# Patient Record
Sex: Male | Born: 1995 | Hispanic: Yes | Marital: Single | Smoking: Never smoker
Health system: Southern US, Community
[De-identification: ages and names within clinical notes are randomized; demographics above are authoritative.]

## PROBLEM LIST (undated history)

## (undated) DIAGNOSIS — F419 Anxiety disorder, unspecified: Secondary | ICD-10-CM

## (undated) DIAGNOSIS — F329 Major depressive disorder, single episode, unspecified: Secondary | ICD-10-CM

## (undated) DIAGNOSIS — F32A Depression, unspecified: Secondary | ICD-10-CM

## (undated) HISTORY — DX: Anxiety disorder, unspecified: F41.9

## (undated) HISTORY — PX: NO PAST SURGERIES: SHX2092

## (undated) HISTORY — DX: Depression, unspecified: F32.A

---
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
## 2019-05-18 ENCOUNTER — Telehealth: Payer: Self-pay

## 2019-05-18 NOTE — Telephone Encounter (Signed)
Copied from CRM [id number]. Topic: General - Call Back - No Documentation >> May 18, 2019  3:51 PM Randol Kern wrote: Has new pt appt 05/28/2019. Wants to come in this week for immunizations for school.  Best contact [phone] Requesting immunizations for school Tdap + (pt cant remember)

## 2019-05-19 NOTE — Telephone Encounter (Signed)
Attempted to contact patient on home phone and number listed below in message with no answer, Okay to schedule appointment this week with Spencer Bryant for Tdap vaccine. After reviewing records patient is due for this and influenza shot. KW

## 2019-05-20 ENCOUNTER — Other Ambulatory Visit: Payer: Self-pay

## 2019-05-20 ENCOUNTER — Encounter: Payer: Self-pay | Admitting: Adult Health

## 2019-05-20 ENCOUNTER — Ambulatory Visit (INDEPENDENT_AMBULATORY_CARE_PROVIDER_SITE_OTHER): Payer: Managed Care, Other (non HMO) | Admitting: Adult Health

## 2019-05-20 VITALS — Temp 96.9°F

## 2019-05-20 DIAGNOSIS — Z23 Encounter for immunization: Secondary | ICD-10-CM | POA: Diagnosis not present

## 2019-05-20 NOTE — Telephone Encounter (Signed)
Patient has scheduled appointment this afternoon to receive vaccine. KW

## 2019-05-20 NOTE — Progress Notes (Signed)
Patient comes into office today to update immunization(s), patient reports that they feel well today and have no complaints or concerns to address. Immunization record has been reviewed with patient and information handout in regards to vaccine has been given. Patient was observed after injection and tolerated well with no adverse reaction or side effects.   Today's Vitals   05/20/19 1531  Temp: (!) 96.9 F (36.1 C)  TempSrc: Oral   There is no height or weight on file to calculate BMI.

## 2019-05-28 ENCOUNTER — Encounter: Payer: Self-pay | Admitting: Adult Health

## 2019-05-28 ENCOUNTER — Other Ambulatory Visit: Payer: Self-pay

## 2019-05-28 ENCOUNTER — Ambulatory Visit (INDEPENDENT_AMBULATORY_CARE_PROVIDER_SITE_OTHER): Payer: Managed Care, Other (non HMO) | Admitting: Adult Health

## 2019-05-28 VITALS — BP 114/80 | HR 65 | Temp 96.9°F | Resp 15 | Ht 65.0 in | Wt 183.4 lb

## 2019-05-28 DIAGNOSIS — Z0000 Encounter for general adult medical examination without abnormal findings: Secondary | ICD-10-CM | POA: Diagnosis not present

## 2019-05-28 DIAGNOSIS — Z1329 Encounter for screening for other suspected endocrine disorder: Secondary | ICD-10-CM

## 2019-05-28 DIAGNOSIS — Z111 Encounter for screening for respiratory tuberculosis: Secondary | ICD-10-CM

## 2019-05-28 DIAGNOSIS — R635 Abnormal weight gain: Secondary | ICD-10-CM

## 2019-05-28 DIAGNOSIS — Z13220 Encounter for screening for lipoid disorders: Secondary | ICD-10-CM

## 2019-05-28 DIAGNOSIS — E669 Obesity, unspecified: Secondary | ICD-10-CM

## 2019-05-28 DIAGNOSIS — M25562 Pain in left knee: Secondary | ICD-10-CM

## 2019-05-28 NOTE — Patient Instructions (Addendum)
Preventive Care 1-22 Years Old, Male Preventive care refers to lifestyle choices and visits with your health care provider that can promote health and wellness. At this stage in your life, you may start seeing a primary care physician instead of a pediatrician. Your health care is now your responsibility. Preventive care for young adults includes: A yearly physical exam. This is also called an annual wellness visit. Acute Knee Pain, Adult Many things can cause knee pain. Sometimes, knee pain is sudden (acute) and may be caused by damage, swelling, or irritation of the muscles and tissues that support your knee. The pain often goes away on its own with time and rest. If the pain does not go away, tests may be done to find out what is causing the pain. Follow these instructions at home: Pay attention to any changes in your symptoms. Take these actions to relieve your pain. If you have a knee sleeve or brace:  Wear the sleeve or brace as told by your doctor. Remove it only as told by your doctor. Loosen the sleeve or brace if your toes: Tingle. Become numb. Turn cold and blue. Keep the sleeve or brace clean. If the sleeve or brace is not waterproof: Do not let it get wet. Cover it with a watertight covering when you take a bath or shower. Activity Rest your knee. Do not do things that cause pain. Avoid activities where both feet leave the ground at the same time (high-impact activities). Examples are running, jumping rope, and doing jumping jacks. Work with a physical therapist to make a safe exercise program, as told by your doctor. Managing pain, stiffness, and swelling  If told, put ice on the knee: Put ice in a plastic bag. Place a towel between your skin and the bag. Leave the ice on for 20 minutes, 2-3 times a day. If told, put pressure (compression) on your injured knee to control swelling, give support, and help with discomfort. Compression may be done with an elastic  bandage. General instructions Take all medicines only as told by your doctor. Raise (elevate) your knee while you are sitting or lying down. Make sure your knee is higher than your heart. Sleep with a pillow under your knee. Do not use any products that contain nicotine or tobacco. These include cigarettes, e-cigarettes, and chewing tobacco. These products may slow down healing. If you need help quitting, ask your doctor. If you are overweight, work with your doctor and a food expert (dietitian) to set goals to lose weight. Being overweight can make your knee hurt more. Keep all follow-up visits as told by your doctor. This is important. Contact a doctor if: The knee pain does not stop. The knee pain changes or gets worse. You have a fever along with knee pain. Your knee feels warm when you touch it. Your knee gives out or locks up. Get help right away if: Your knee swells, and the swelling gets worse. You cannot move your knee. You have very bad knee pain. Summary Many things can cause knee pain. The pain often goes away on its own with time and rest. Your doctor may do tests to find out the cause of the pain. Pay attention to any changes in your symptoms. Relieve your pain with rest, medicines, light activity, and use of ice. Get help right away if you cannot move your knee or your knee pain is very bad. This information is not intended to replace advice given to you by your health care  provider. Make sure you discuss any questions you have with your health care provider. Document Revised: 10/02/2017 Document Reviewed: 10/02/2017 Elsevier Patient Education  2020 [employer]    Regular dental and eye exams.  Immunizations.  Screening for certain conditions.  Healthy lifestyle choices, such as diet and exercise. What can I expect for my preventive care visit? Physical exam Your health care provider may check:  Height and weight. These may be used to calculate body mass  index (BMI), which is a measurement that tells if you are at a healthy weight.  Heart rate and blood pressure.  Body temperature. Counseling Your health care provider may ask you questions about:  Past medical problems and family medical history.  Alcohol, tobacco, and drug use.  Home and relationship well-being.  Access to firearms.  Emotional well-being.  Diet, exercise, and sleep habits.  Sexual activity and sexual health. What immunizations do I need?  Influenza (flu) vaccine  This is recommended every year. Tetanus, diphtheria, and pertussis (Tdap) vaccine  You may need a Td booster every 10 years. Varicella (chickenpox) vaccine  You may need this vaccine if you have not already been vaccinated. Human papillomavirus (HPV) vaccine  If recommended by your health care provider, you may need three doses over 6 months. Measles, mumps, and rubella (MMR) vaccine  You may need at least one dose of MMR. You may also need a second dose. Meningococcal conjugate (MenACWY) vaccine  One dose is recommended if you are 19-21 years old and a first-year college student living in a residence hall, or if you have one of several medical conditions. You may also need additional booster doses. Pneumococcal conjugate (PCV13) vaccine  You may need this if you have certain conditions and were not previously vaccinated. Pneumococcal polysaccharide (PPSV23) vaccine  You may need one or two doses if you smoke cigarettes or if you have certain conditions. Hepatitis A vaccine  You may need this if you have certain conditions or if you travel or work in places where you may be exposed to hepatitis A. Hepatitis B vaccine  You may need this if you have certain conditions or if you travel or work in places where you may be exposed to hepatitis B. Haemophilus influenzae type b (Hib) vaccine  You may need this if you have certain risk factors. You may receive vaccines as individual doses or  as more than one vaccine together in one shot (combination vaccines). Talk with your health care provider about the risks and benefits of combination vaccines. What tests do I need? Blood tests  Lipid and cholesterol levels. These may be checked every 5 years starting at age 20.  Hepatitis C test.  Hepatitis B test. Screening  Genital exam to check for testicular cancer or hernias.  Sexually transmitted disease (STD) testing, if you are at risk. Other tests  Tuberculosis skin test.  Vision and hearing tests.  Skin exam. Follow these instructions at home: Eating and drinking   Eat a diet that includes fresh fruits and vegetables, whole grains, lean protein, and low-fat dairy products.  Drink enough fluid to keep your urine pale yellow.  Do not drink alcohol if: ? Your health care provider tells you not to drink. ? You are under the legal drinking age. In the [place] the legal drinking age is 21.  If you drink alcohol: ? Limit how much you have to 0-2 drinks a day. ? Be aware of how much alcohol is in your drink. In the   [place] one drink equals one 12 oz bottle of beer (355 mL), one 5 oz glass of wine (148 mL), or one 1 oz glass of hard liquor (44 mL). Lifestyle  Take daily care of your teeth and gums.  Stay active. Exercise at least 30 minutes 5 or more days of the week.  Do not use any products that contain nicotine or tobacco, such as cigarettes, e-cigarettes, and chewing tobacco. If you need help quitting, ask your health care provider.  Do not use drugs.  If you are sexually active, practice safe sex. Use a condom or other form of protection to prevent STIs (sexually transmitted infections).  Find healthy ways to cope with stress, such as: ? Meditation, yoga, or listening to music. ? Journaling. ? Talking to a trusted person. ? Spending time with friends and family. Safety  Always wear your seat belt while driving or riding in a vehicle.  Do not drive if you  have been drinking alcohol.  Do not ride with someone who has been drinking.  Do not drive when you are tired or distracted.  Do not text while driving.  Wear a helmet and other protective equipment during sports activities.  If you have firearms in your house, make sure you follow all gun safety procedures.  Seek help if you have been bullied, physically abused, or sexually abused.  Use the Internet responsibly to avoid dangers such as online bullying and online sex predators. What's next?  Go to your health care provider once a year for a well check visit.  Ask your health care provider how often you should have your eyes and teeth checked.  Stay up to date on all vaccines. This information is not intended to replace advice given to you by your health care provider. Make sure you discuss any questions you have with your health care provider. Document Revised: 04/16/2018 Document Reviewed: 04/16/2018 Elsevier Patient Education  2020 [employer]  

## 2019-05-28 NOTE — Progress Notes (Signed)
Patient: Spencer Bryant, Male    DOB: [birth year], 24 y.o.   MRN: [id number] Visit Date: 05/28/2019  Today's Provider: Jairo Ben, FNP   Chief Complaint  Patient presents with  . New Patient (Initial Visit)   Subjective:    New Patient Spencer Bryant is a 24 y.o. male who presents today for health maintenance and establish care. He feels well. He reports exercising . He reports he is sleeping well.  He is transferring to [employer] from [place]. He is planning to live on campus. He is concerning of his weight he has gained in college. He reports 40 lbs since starting college. He is currently a Holiday representative.  He denies any fatigue. Has any skin concerns or rashes.  Denies any changing lesions or moles. VS PCP pediatrician was pediatrics [facility]. He exercises regularly.  He denies any STD symptoms or concerns. He has one partner for the last 4 years.   He got influenza at [facility], 2 weeks ago- 2021 January.   Patient also needs a immunization record filled out from his school at [employer].  He came in on January 14 and got his Tdap shot so that he would be up-to-date.  Provider will review immunizations and and CDR and sent to school once his chronic Quantiferon Gold returns.  He declines HPV vaccine. He does see a dentist yearly and sees an eye doctor as he wears contacts.  He does not sleep in his contacts. He has a history of knee pain that comes intermittent, occurred in February. Resolved. Occasionally has pains in knee. He wears a brace and  Continues his day. Denies any popping or clicking. Denies any trauma.   He denies any other concerns at this visit.  Denies any suicidal or homicidal thoughts or ideas/intentions.  He has never been on medication for depression, he had feels he is coping well. Patient  denies any fever, body aches,chills, rash, chest pain, shortness of breath, nausea, vomiting, or diarrhea.    -----------------------------------------------------------------   Review of Systems  Constitutional: Positive for unexpected weight change (He is concerned that he has gained 40 pounds since starting his freshman year.  Is currently a Holiday representative.). Negative for activity change, appetite change, chills, diaphoresis, fatigue and fever.  HENT: Negative.   Eyes: Negative.   Respiratory: Negative.   Cardiovascular: Negative.   Gastrointestinal: Negative.   Endocrine: Negative.   Genitourinary: Negative.   Musculoskeletal: Positive for arthralgias (left knee ). Negative for back pain, gait problem, joint swelling, myalgias, neck pain and neck stiffness.  Skin: Negative.   Allergic/Immunologic: Negative.   Neurological: Negative.   Hematological: Negative.   Psychiatric/Behavioral: Negative for agitation, behavioral problems, confusion, decreased concentration, dysphoric mood, hallucinations, self-injury, sleep disturbance and suicidal ideas. The patient is not nervous/anxious and is not hyperactive.   All other systems reviewed and are negative.   Social History He  reports that he has never smoked. He has never used smokeless tobacco. He reports current alcohol use of about 5.0 standard drinks of alcohol per week. He reports current drug use. Social History   Socioeconomic History  . Marital status: Single    Spouse name: Not on file  . Number of children: Not on file  . Years of education: Not on file  . Highest education level: Not on file  Occupational History  . Not on file  Tobacco Use  . Smoking status: Never Smoker  .  Smokeless tobacco: Never Used  Substance and Sexual Activity  . Alcohol use: Yes    Alcohol/week: 5.0 standard drinks    Types: 2 Cans of beer, 3 Shots of liquor per week  . Drug use: Yes  . Sexual activity: Not on file  Other Topics Concern  . Not on file  Social History Narrative  . Not on file   Social Determinants of Health   Financial Resource  Strain:   . Difficulty of Paying Living Expenses: Not on file  Food Insecurity:   . Worried About Programme researcher, broadcasting/film/videounning Out of Food in the Last Year: Not on file  . Ran Out of Food in the Last Year: Not on file  Transportation Needs:   . Lack of Transportation (Medical): Not on file  . Lack of Transportation (Non-Medical): Not on file  Physical Activity:   . Days of Exercise per Week: Not on file  . Minutes of Exercise per Session: Not on file  Stress:   . Feeling of Stress : Not on file  Social Connections:   . Frequency of Communication with Friends and Family: Not on file  . Frequency of Social Gatherings with Friends and Family: Not on file  . Attends Religious Services: Not on file  . Active Member of Clubs or Organizations: Not on file  . Attends BankerClub or Organization Meetings: Not on file  . Marital Status: Not on file    There are no problems to display for this patient.   Past Surgical History:  Procedure Laterality Date  . NO PAST SURGERIES      Family History  No family status information on file.   His family history is not on file.     No Known Allergies  Previous Medications   DOXYCYCLINE (VIBRAMYCIN) 100 MG CAPSULE    Take 1 capsule (100 mg total) by mouth 2 (two) times daily.   FAMOTIDINE (PEPCID) 20 MG TABLET    Take 1 tablet (20 mg total) by mouth 2 (two) times daily.   PREDNISONE (DELTASONE) 20 MG TABLET    3 tablets daily x 4 days    Patient Care Team: Berniece PapFlinchum, Michelle S, FNP as PCP - General (Family Medicine)      Objective:   Vitals: BP 114/80   Pulse 65   Temp (!) 96.9 F (36.1 C) (Oral)   Resp 15   Ht 5\' 5"  (1.651 m)   Wt 183 lb 6.4 oz (83.2 kg)   SpO2 99%   BMI 30.52 kg/m    Physical Exam Vitals and nursing note reviewed.  Constitutional:      General: He is not in acute distress.    Appearance: Normal appearance. He is well-developed. He is obese. He is not ill-appearing, toxic-appearing or diaphoretic.     Comments: Patient is alert and  oriented and responsive to questions Engages in eye contact with provider. Speaks in full sentences without any pauses without any shortness of breath or distress.    HENT:     Head: Normocephalic and atraumatic.     Right Ear: Hearing, tympanic membrane, ear canal and external ear normal.     Left Ear: Hearing, tympanic membrane, ear canal and external ear normal.     Nose: Nose normal.     Mouth/Throat:     Mouth: Mucous membranes are moist.     Pharynx: Oropharynx is clear. Uvula midline. No oropharyngeal exudate or posterior oropharyngeal erythema.  Eyes:     General: Lids are normal. No  scleral icterus.       Right eye: No discharge.        Left eye: No discharge.     Conjunctiva/sclera: Conjunctivae normal.     Pupils: Pupils are equal, round, and reactive to light.  Neck:     Thyroid: No thyromegaly.     Vascular: Normal carotid pulses. No carotid bruit, hepatojugular reflux or JVD.     Trachea: Trachea and phonation normal. No tracheal tenderness or tracheal deviation.     Meningeal: Brudzinski's sign absent.  Cardiovascular:     Rate and Rhythm: Normal rate and regular rhythm.     Pulses: Normal pulses.     Heart sounds: Normal heart sounds, S1 normal and S2 normal. Heart sounds not distant. No murmur. No friction rub. No gallop.   Pulmonary:     Effort: Pulmonary effort is normal. No accessory muscle usage or respiratory distress.     Breath sounds: Normal breath sounds. No stridor. No wheezing or rales.  Chest:     Chest wall: No tenderness.  Abdominal:     General: Bowel sounds are normal. There is no distension.     Palpations: Abdomen is soft. There is no mass.     Tenderness: There is no abdominal tenderness. There is no guarding or rebound.     Hernia: No hernia is present.  Genitourinary:    Comments: Deferred no concerns rectally or testicular pain all at this time. Musculoskeletal:        General: No swelling, deformity or signs of injury. Normal range of  motion.     Cervical back: Full passive range of motion without pain, normal range of motion and neck supple.     Right knee: Normal.     Instability Tests: Negative medial McMurray test and negative lateral McMurray test.     Left knee: Normal. No swelling, deformity, effusion, erythema, ecchymosis, lacerations, bony tenderness or crepitus. Normal range of motion. No tenderness. No medial joint line, lateral joint line, MCL, LCL, ACL, PCL or patellar tendon tenderness. No LCL laxity, MCL laxity, ACL laxity or PCL laxity.Normal alignment, normal meniscus and normal patellar mobility. Normal pulse.     Instability Tests: Negative medial McMurray test and negative lateral McMurray test.     Right lower leg: Normal. No swelling. No edema.     Left lower leg: Normal. No swelling. No edema.     Comments: Patient moves on and off of exam table and in room without difficulty. Gait is normal in hall and in room. Patient is oriented to person place time and situation. Patient answers questions appropriately and engages in conversation.   Lymphadenopathy:     Head:     Right side of head: No submental, submandibular, tonsillar, preauricular, posterior auricular or occipital adenopathy.     Left side of head: No submental, submandibular, tonsillar, preauricular, posterior auricular or occipital adenopathy.     Cervical: No cervical adenopathy.  Skin:    General: Skin is warm and dry.     Capillary Refill: Capillary refill takes less than 2 seconds.     Coloration: Skin is not jaundiced or pale.     Findings: No bruising, erythema, lesion or rash.     Nails: There is no clubbing.  Neurological:     General: No focal deficit present.     Mental Status: He is alert and oriented to person, place, and time.     GCS: GCS eye subscore is 4. GCS verbal subscore is  5. GCS motor subscore is 6.     Cranial Nerves: No cranial nerve deficit.     Sensory: No sensory deficit.     Motor: No weakness or abnormal  muscle tone.     Coordination: Coordination normal.     Gait: Gait normal.     Deep Tendon Reflexes: Reflexes are normal and symmetric. Reflexes normal.  Psychiatric:        Mood and Affect: Mood normal.        Speech: Speech normal.        Behavior: Behavior normal.        Thought Content: Thought content normal.        Judgment: Judgment normal.      Depression Screen PHQ 2/9 Scores 05/20/2019  PHQ - 2 Score 1  PHQ- 9 Score 8     Clinical Support from 05/20/2019 in [place]  PHQ-9 Total Score  8        Assessment & Plan:     Routine Health Maintenance and Physical Exam  Exercise Activities and Dietary recommendations Goals   None     Immunization History  Administered Date(s) Administered  . Tdap 05/20/2019    Health Maintenance  Topic Date Due  . HIV Screening  08/24/2012  . INFLUENZA VACCINE  12/05/2018  . TETANUS/TDAP  05/19/2029     1. Screening for tuberculosis Hold form for his school until results come back and review and CDI are immunization records. - QuantiFERON-TB Gold Plus  2. Annual physical exam Screenings performed with labs. - CBC with Differential/Platelet - Comprehensive Metabolic Panel (CMET) - Lipid Panel w/o Chol/HDL Ratio - TSH Declines any need for STD testing, HIV testing.  He is aware he can return to the office should he need any of those test done.  He will make an office visit.  He denies any symptoms or exposures. 3. Obesity (BMI 30.0-34.9) Encourage healthy diet and lifestyle.  4. Acute pain of left knee Discussed he can have a left knee x-ray done at [place], will call with results.  If knee persist with pain he should be seen at [facility], I am happy to place a referral if needed.  Exam today was normal. - DG Knee Complete 4 Views Left; Future - CBC with Differential/Platelet - Comprehensive Metabolic Panel (CMET)  5. Screening for thyroid disorder - TSH  6. Screening for  cholesterol level  - Lipid Panel w/o Chol/HDL Ratio  7. Weight gain Discussed healthy diet and lifestyle, decreased sedentary lifestyle and increased activity.  Healthy diet and food choices discussed. - CBC with Differential/Platelet - Comprehensive Metabolic Panel (CMET) - Lipid Panel w/o Chol/HDL Ratio - TSH  8. Screening for hyperlipidemia - Lipid Panel w/o Chol/HDL Ratio  Discussed health benefits of physical activity, and encouraged him to engage in regular exercise appropriate for his age and condition.    He had a records release for pediatrics [facility] previous pediatrician.  Advised patient call the office or your primary care doctor for an appointment if no improvement within 72 hours or if any symptoms change or worsen at any time  Advised ER or urgent Care if after hours or on weekend. Call 911 for emergency symptoms at any time.Patinet verbalized understanding of all instructions given/reviewed and treatment plan and has no further questions or concerns at this time.    Patient verbalized understanding of all instructions given and denies any further questions at this time.   The entirety of  the information documented in the History of Present Illness, Review of Systems and Physical Exam were personally obtained by me. Portions of this information were initially documented by the  Certified Medical Assistant whose name is documented in Epic and reviewed by me for thoroughness and accuracy.  I have personally performed the exam and reviewed the chart and it is accurate to the best of my knowledge.  Museum/gallery conservator has been used and any errors in dictation or transcription are unintentional.  Eula Fried. Flinchum FNP-C  [facility]  --------------------------------------------------------------------

## 2019-06-01 LAB — LIPID PANEL W/O CHOL/HDL RATIO
Cholesterol, Total: 150 mg/dL (ref 100–199)
HDL: 49 mg/dL (ref >39)
LDL Chol Calc (NIH): 84 mg/dL (ref 0–99)
Triglycerides: 88 mg/dL (ref 0–149)
VLDL Cholesterol Cal: 17 mg/dL (ref 5–40)

## 2019-06-01 LAB — COMPREHENSIVE METABOLIC PANEL
ALT: 59 IU/L — ABNORMAL HIGH (ref 0–44)
AST: 33 IU/L (ref 0–40)
Albumin/Globulin Ratio: 2.4 — ABNORMAL HIGH (ref 1.2–2.2)
Albumin: 5 g/dL (ref 4.1–5.2)
Alkaline Phosphatase: 74 IU/L (ref 39–117)
BUN/Creatinine Ratio: 16 (ref 9–20)
BUN: 14 mg/dL (ref 6–20)
Bilirubin Total: 0.8 mg/dL (ref 0.0–1.2)
CO2: 25 mmol/L (ref 20–29)
Calcium: 9.5 mg/dL (ref 8.7–10.2)
Chloride: 102 mmol/L (ref 96–106)
Creatinine, Ser: 0.86 mg/dL (ref 0.76–1.27)
GFR calc Af Amer: 143 mL/min/{1.73_m2} (ref >59)
GFR calc non Af Amer: 124 mL/min/{1.73_m2} (ref >59)
Globulin, Total: 2.1 g/dL (ref 1.5–4.5)
Glucose: 90 mg/dL (ref 65–99)
Potassium: 4.2 mmol/L (ref 3.5–5.2)
Sodium: 140 mmol/L (ref 134–144)
Total Protein: 7.1 g/dL (ref 6.0–8.5)

## 2019-06-01 LAB — QUANTIFERON-TB GOLD PLUS
QuantiFERON Mitogen Value: >10 IU/mL
QuantiFERON Nil Value: 0.02 IU/mL
QuantiFERON TB1 Ag Value: 0.02 IU/mL
QuantiFERON TB2 Ag Value: 0.02 IU/mL
QuantiFERON-TB Gold Plus: NEGATIVE

## 2019-06-01 LAB — CBC WITH DIFFERENTIAL/PLATELET
Basophils Absolute: 0.1 10*3/uL (ref 0.0–0.2)
Basos: 1 %
EOS (ABSOLUTE): 0.1 10*3/uL (ref 0.0–0.4)
Eos: 2 %
Hematocrit: 44.5 % (ref 37.5–51.0)
Hemoglobin: 15.7 g/dL (ref 13.0–17.7)
Immature Grans (Abs): 0 10*3/uL (ref 0.0–0.1)
Immature Granulocytes: 0 %
Lymphocytes Absolute: 2.2 10*3/uL (ref 0.7–3.1)
Lymphs: 31 %
MCH: 32 pg (ref 26.6–33.0)
MCHC: 35.3 g/dL (ref 31.5–35.7)
MCV: 91 fL (ref 79–97)
Monocytes Absolute: 0.5 10*3/uL (ref 0.1–0.9)
Monocytes: 6 %
Neutrophils Absolute: 4.4 10*3/uL (ref 1.4–7.0)
Neutrophils: 60 %
Platelets: 249 10*3/uL (ref 150–450)
RBC: 4.9 x10E6/uL (ref 4.14–5.80)
RDW: 12.7 % (ref 11.6–15.4)
WBC: 7.3 10*3/uL (ref 3.4–10.8)

## 2019-06-01 LAB — TSH: TSH: 1.24 u[IU]/mL (ref 0.450–4.500)

## 2019-06-01 NOTE — Progress Notes (Signed)
TB test is negative.  Cholesterol is normal.  TSH is normal. CBC is normal no signs of anemia or infection.  CMP electrolytes normal, kidney function is normal. ALT liver  enzyme mild elevation 59 normal is 0-44 , decrease any alcohol or tylenol use. Recheck CMP in 4- 6 months.

## 2019-08-05 ENCOUNTER — Other Ambulatory Visit: Payer: Self-pay

## 2019-08-05 ENCOUNTER — Ambulatory Visit: Payer: Self-pay | Attending: Internal Medicine

## 2019-08-05 ENCOUNTER — Ambulatory Visit: Payer: Self-pay

## 2019-08-05 DIAGNOSIS — Z23 Encounter for immunization: Secondary | ICD-10-CM

## 2019-08-05 NOTE — Progress Notes (Signed)
   Covid-19 Vaccination Clinic  Name:  Spencer Bryant    MRN: [id number] DOB: [birth year]  08/05/2019  Mr. Spencer Bryant was observed post Covid-19 immunization for 15 minutes without incident. He was provided with Vaccine Information Sheet and instruction to access the V-Safe system.   Mr. Spencer Bryant was instructed to call 911 with any severe reactions post vaccine: Marland Kitchen Difficulty breathing  . Swelling of face and throat  . A fast heartbeat  . A bad rash all over body  . Dizziness and weakness   Immunizations Administered    Name Date Dose VIS Date Route   Pfizer COVID-19 Vaccine 08/05/2019  9:46 AM 0.3 mL 04/16/2019 Intramuscular   Manufacturer: [employer], [employer]   Lot: [id number]   NDC: [address]-3

## 2019-08-06 ENCOUNTER — Ambulatory Visit: Payer: Self-pay

## 2019-09-01 ENCOUNTER — Ambulatory Visit: Payer: Self-pay | Attending: Internal Medicine

## 2019-09-01 DIAGNOSIS — Z23 Encounter for immunization: Secondary | ICD-10-CM

## 2019-09-01 NOTE — Progress Notes (Signed)
   Covid-19 Vaccination Clinic  Name:  Jeorge Reister    MRN: [id number] DOB: 1995/11/26  09/01/2019  Mr. Iverson Sees was observed post Covid-19 immunization for 15 minutes without incident. He was provided with Vaccine Information Sheet and instruction to access the V-Safe system.   Mr. Altariq Goodall was instructed to call 911 with any severe reactions post vaccine: Marland Kitchen Difficulty breathing  . Swelling of face and throat  . A fast heartbeat  . A bad rash all over body  . Dizziness and weakness   Immunizations Administered    Name Date Dose VIS Date Route   Pfizer COVID-19 Vaccine 09/01/2019  4:55 PM 0.3 mL 06/30/2018 Intramuscular   Manufacturer: [employer], [employer]   Lot: [id number]   NDC: [address]-3

## 2021-01-10 ENCOUNTER — Encounter: Payer: Self-pay | Admitting: Podiatry

## 2021-01-10 ENCOUNTER — Other Ambulatory Visit: Payer: Self-pay

## 2021-01-10 ENCOUNTER — Ambulatory Visit (INDEPENDENT_AMBULATORY_CARE_PROVIDER_SITE_OTHER): Payer: Self-pay | Admitting: Podiatry

## 2021-01-10 DIAGNOSIS — L600 Ingrowing nail: Secondary | ICD-10-CM

## 2021-01-10 NOTE — Progress Notes (Signed)
  Subjective:  Patient ID: Spencer Bryant, male    DOB: [birth year],  MRN: [id number]  Chief Complaint  Patient presents with   Ingrown Toenail     New pt-Left foot Big toe ingrown --SELF PAY     25 y.o. male presents with the above complaint. History confirmed with patient.   Objective:  Physical Exam: warm, good capillary refill, no trophic changes or ulcerative lesions, normal DP and PT pulses, and normal sensory exam. Left Foot: Ingrown nail without paronychia medial border Assessment:   1. Ingrowing left great toenail      Plan:  Patient was evaluated and treated and all questions answered.    Ingrown Nail, left -Patient elects to proceed with minor surgery to remove ingrown toenail today. Consent reviewed and signed by patient. -Ingrown nail excised. See procedure note. -Educated on post-procedure care including soaking. Written instructions provided and reviewed.   Procedure: Excision of Ingrown Toenail Location: Left 1st toe medial nail borders. Anesthesia: Lidocaine 1% plain; 1.5 mL and Marcaine 0.5% plain; 1.5 mL, digital block. Skin Prep: Betadine. Dressing: Silvadene; telfa; dry, sterile, compression dressing. Technique: Following skin prep, the toe was exsanguinated and a tourniquet was secured at the base of the toe. The affected nail border was freed, split with a nail splitter, and excised. Chemical matrixectomy was then performed with phenol and irrigated out with alcohol. The tourniquet was then removed and sterile dressing applied. Disposition: Patient tolerated procedure well.  Return if symptoms worsen or fail to improve.

## 2021-01-10 NOTE — Patient Instructions (Signed)
